# Patient Record
Sex: Male | Born: 1990 | Race: White | Hispanic: No | Smoking: Never smoker
Health system: Southern US, Community
[De-identification: ages and names within clinical notes are randomized; demographics above are authoritative.]

## PROBLEM LIST (undated history)

## (undated) DIAGNOSIS — T8859XA Other complications of anesthesia, initial encounter: Secondary | ICD-10-CM

## (undated) DIAGNOSIS — T4145XA Adverse effect of unspecified anesthetic, initial encounter: Secondary | ICD-10-CM

## (undated) HISTORY — PX: SHOULDER SURGERY: SHX246

---

## 2016-07-12 ENCOUNTER — Emergency Department
Admission: EM | Admit: 2016-07-12 | Discharge: 2016-07-12 | Disposition: A | Discharge status: Home / Self Care | Payer: Worker's Compensation | Attending: Emergency Medicine | Admitting: Emergency Medicine

## 2016-07-12 ENCOUNTER — Encounter: Payer: Self-pay | Admitting: Emergency Medicine

## 2016-07-12 DIAGNOSIS — S60511A Abrasion of right hand, initial encounter: Secondary | ICD-10-CM

## 2016-07-12 DIAGNOSIS — Y990 Civilian activity done for income or pay: Secondary | ICD-10-CM | POA: Insufficient documentation

## 2016-07-12 DIAGNOSIS — Y9389 Activity, other specified: Secondary | ICD-10-CM | POA: Diagnosis not present

## 2016-07-12 DIAGNOSIS — Z7721 Contact with and (suspected) exposure to potentially hazardous body fluids: Secondary | ICD-10-CM

## 2016-07-12 DIAGNOSIS — Y929 Unspecified place or not applicable: Secondary | ICD-10-CM | POA: Diagnosis not present

## 2016-07-12 DIAGNOSIS — S61411A Laceration without foreign body of right hand, initial encounter: Secondary | ICD-10-CM | POA: Diagnosis present

## 2016-07-12 DIAGNOSIS — R6884 Jaw pain: Secondary | ICD-10-CM | POA: Diagnosis not present

## 2016-07-12 MED ORDER — AMOXICILLIN-POT CLAVULANATE 875-125 MG PO TABS: 1.0000 | ORAL_TABLET | Freq: Two times a day (BID) | ORAL | 0 refills | Status: DC
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
## 2016-07-12 NOTE — ED Notes (Signed)
Blood work walked to lab by this RN

## 2016-09-26 ENCOUNTER — Ambulatory Visit: Payer: Self-pay | Admitting: Physician Assistant

## 2016-10-28 ENCOUNTER — Encounter (INDEPENDENT_AMBULATORY_CARE_PROVIDER_SITE_OTHER): Payer: Self-pay

## 2016-10-28 ENCOUNTER — Ambulatory Visit: Payer: Self-pay | Admitting: Physician Assistant

## 2016-10-28 ENCOUNTER — Encounter: Payer: Self-pay | Admitting: Physician Assistant

## 2016-10-28 VITALS — BP 136/79 | HR 69 | Temp 98.5°F | Resp 16 | Ht 68.0 in | Wt 214.0 lb

## 2016-10-28 DIAGNOSIS — Z0000 Encounter for general adult medical examination without abnormal findings: Secondary | ICD-10-CM

## 2016-10-28 NOTE — Addendum Note (Signed)
Addended by: Catha BrowEACON, Siona Coulston T on: 10/28/2016 08:53 AM   Modules accepted: Orders

## 2016-10-28 NOTE — Progress Notes (Signed)
   Subjective: Physical exam    Patient ID: Cody Dudley, male    DOB: 12/06/1990, 26 y.o.   MRN: [id number]  HPI Patient present for annual physical exam. Voices no concerns.   Review of Systems Negative    Objective:   Physical Exam HEENT unremarkable. Neck supple w/o adenopathy. Lungs CTA and Heart RRR. Negative HSM, normoactive BS, SNTTP.  No upper/lower extremity deformities. F/E ROM. Strength 5/5. No cervical of lumbar spine deformity. F/E ROM. CNII-XII grossly intact.       Assessment & Plan:Well Exam.  Follow up for lab results.

## 2016-10-29 LAB — VITAMIN D 25 HYDROXY (VIT D DEFICIENCY, FRACTURES): Vit D, 25-Hydroxy: 32.6 ng/mL (ref 30.0–100.0)

## 2016-10-29 LAB — CMP12+LP+TP+TSH+6AC+CBC/D/PLT
A/G RATIO: 1.7 (ref 1.2–2.2)
ALBUMIN: 4.3 g/dL (ref 3.5–5.5)
ALT: 21 IU/L (ref 0–44)
AST: 15 IU/L (ref 0–40)
Alkaline Phosphatase: 92 IU/L (ref 39–117)
BILIRUBIN TOTAL: 0.6 mg/dL (ref 0.0–1.2)
BUN/Creatinine Ratio: 11 (ref 9–20)
BUN: 11 mg/dL (ref 6–20)
Basophils Absolute: 0 10*3/uL (ref 0.0–0.2)
Basos: 0 %
CHOLESTEROL TOTAL: 134 mg/dL (ref 100–199)
Calcium: 9 mg/dL (ref 8.7–10.2)
Chloride: 104 mmol/L (ref 96–106)
Chol/HDL Ratio: 4.1 ratio (ref 0.0–5.0)
Creatinine, Ser: 0.96 mg/dL (ref 0.76–1.27)
EOS (ABSOLUTE): 0.1 10*3/uL (ref 0.0–0.4)
EOS: 1 %
ESTIMATED CHD RISK: 0.8 times avg. (ref 0.0–1.0)
FREE THYROXINE INDEX: 2 (ref 1.2–4.9)
GFR calc non Af Amer: 108 mL/min/{1.73_m2} (ref >59)
GFR, EST AFRICAN AMERICAN: 125 mL/min/{1.73_m2} (ref >59)
GGT: 16 IU/L (ref 0–65)
Globulin, Total: 2.5 g/dL (ref 1.5–4.5)
Glucose: 90 mg/dL (ref 65–99)
HDL: 33 mg/dL — AB (ref >39)
HEMOGLOBIN: 16.6 g/dL (ref 13.0–17.7)
Hematocrit: 49.1 % (ref 37.5–51.0)
IMMATURE GRANS (ABS): 0 10*3/uL (ref 0.0–0.1)
IRON: 90 ug/dL (ref 38–169)
Immature Granulocytes: 0 %
LDH: 185 IU/L (ref 121–224)
LDL Calculated: 88 mg/dL (ref 0–99)
Lymphocytes Absolute: 1.5 10*3/uL (ref 0.7–3.1)
Lymphs: 23 %
MCH: 30.6 pg (ref 26.6–33.0)
MCHC: 33.8 g/dL (ref 31.5–35.7)
MCV: 91 fL (ref 79–97)
Monocytes Absolute: 0.4 10*3/uL (ref 0.1–0.9)
Monocytes: 7 %
NEUTROS ABS: 4.6 10*3/uL (ref 1.4–7.0)
NEUTROS PCT: 69 %
PHOSPHORUS: 3.7 mg/dL (ref 2.5–4.5)
PLATELETS: 236 10*3/uL (ref 150–379)
Potassium: 4.2 mmol/L (ref 3.5–5.2)
RBC: 5.42 x10E6/uL (ref 4.14–5.80)
RDW: 13.6 % (ref 12.3–15.4)
SODIUM: 142 mmol/L (ref 134–144)
T3 UPTAKE RATIO: 31 % (ref 24–39)
T4, Total: 6.3 ug/dL (ref 4.5–12.0)
TOTAL PROTEIN: 6.8 g/dL (ref 6.0–8.5)
TSH: 1.06 u[IU]/mL (ref 0.450–4.500)
Triglycerides: 63 mg/dL (ref 0–149)
Uric Acid: 6.4 mg/dL (ref 3.7–8.6)
VLDL CHOLESTEROL CAL: 13 mg/dL (ref 5–40)
WBC: 6.6 10*3/uL (ref 3.4–10.8)

## 2016-11-10 ENCOUNTER — Encounter: Payer: Self-pay | Admitting: Emergency Medicine

## 2016-11-10 ENCOUNTER — Emergency Department: Payer: Worker's Compensation

## 2016-11-10 ENCOUNTER — Emergency Department
Admission: EM | Admit: 2016-11-10 | Discharge: 2016-11-10 | Disposition: A | Discharge status: Home / Self Care | Payer: Worker's Compensation | Attending: Emergency Medicine | Admitting: Emergency Medicine

## 2016-11-10 DIAGNOSIS — Y990 Civilian activity done for income or pay: Secondary | ICD-10-CM | POA: Insufficient documentation

## 2016-11-10 DIAGNOSIS — Y939 Activity, unspecified: Secondary | ICD-10-CM | POA: Insufficient documentation

## 2016-11-10 DIAGNOSIS — W03XXXA Other fall on same level due to collision with another person, initial encounter: Secondary | ICD-10-CM | POA: Diagnosis not present

## 2016-11-10 DIAGNOSIS — Z23 Encounter for immunization: Secondary | ICD-10-CM | POA: Diagnosis not present

## 2016-11-10 DIAGNOSIS — Y929 Unspecified place or not applicable: Secondary | ICD-10-CM | POA: Insufficient documentation

## 2016-11-10 DIAGNOSIS — S6991XA Unspecified injury of right wrist, hand and finger(s), initial encounter: Secondary | ICD-10-CM | POA: Diagnosis present

## 2016-11-10 DIAGNOSIS — M25512 Pain in left shoulder: Secondary | ICD-10-CM | POA: Diagnosis not present

## 2016-11-10 DIAGNOSIS — S60511A Abrasion of right hand, initial encounter: Secondary | ICD-10-CM | POA: Diagnosis not present

## 2016-11-10 MED ORDER — TETANUS-DIPHTH-ACELL PERTUSSIS 5-2.5-18.5 LF-MCG/0.5 IM SUSP
INTRAMUSCULAR | Status: AC
  Administered 2016-11-10: 0.5 mL via INTRAMUSCULAR
  Filled 2016-11-10: qty 0.5

## 2016-11-10 MED ORDER — TETANUS-DIPHTH-ACELL PERTUSSIS 5-2.5-18.5 LF-MCG/0.5 IM SUSP
0.5000 mL | Freq: Once | INTRAMUSCULAR | Status: AC
  Administered 2016-11-10: 0.5 mL via INTRAMUSCULAR
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
## 2016-11-10 NOTE — ED Notes (Signed)
Patient transported to X-ray 

## 2016-11-10 NOTE — Discharge Instructions (Signed)
We gave you a tetanus vaccine today.

## 2016-11-10 NOTE — ED Notes (Signed)
Pt declined imaging.
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
## 2017-05-23 ENCOUNTER — Ambulatory Visit
Admission: EM | Admit: 2017-05-23 | Discharge: 2017-05-23 | Disposition: A | Discharge status: Home / Self Care | Payer: Managed Care, Other (non HMO) | Attending: Family Medicine | Admitting: Family Medicine

## 2017-05-23 ENCOUNTER — Other Ambulatory Visit: Payer: Self-pay

## 2017-05-23 DIAGNOSIS — J039 Acute tonsillitis, unspecified: Secondary | ICD-10-CM

## 2017-05-23 DIAGNOSIS — J029 Acute pharyngitis, unspecified: Secondary | ICD-10-CM | POA: Diagnosis not present

## 2017-05-23 LAB — RAPID STREP SCREEN (MED CTR MEBANE ONLY): Streptococcus, Group A Screen (Direct): NEGATIVE

## 2017-05-23 MED ORDER — DEXAMETHASONE SODIUM PHOSPHATE 10 MG/ML IJ SOLN
10.0000 mg | Freq: Once | INTRAMUSCULAR | Status: AC
  Administered 2017-05-23: 10 mg via INTRAMUSCULAR

## 2017-05-23 MED ORDER — AMOXICILLIN-POT CLAVULANATE 875-125 MG PO TABS: 1.0000 | ORAL_TABLET | Freq: Two times a day (BID) | ORAL | 0 refills | Status: DC

## 2017-05-23 NOTE — ED Triage Notes (Signed)
Patient complains of sore throat that started on Sunday. Patient states that he has painful swallowing and fever. Patient states that he believes he has strep, previous history.

## 2017-05-23 NOTE — Discharge Instructions (Signed)
Tylenol/Motrin as needed for pain/fever °

## 2017-05-23 NOTE — ED Provider Notes (Signed)
[facility]    CSN: [id number] Arrival date & time: 05/23/17  1434     History   Chief Complaint Chief Complaint  Patient presents with  . Sore Throat    HPI Cody Dudley is a 27 y.o. male presented to clinic with CC of sore throat , fever x 2 days. Painful swallowing. Taking Naprosyn for pain and fever.  The history is provided by the patient.  Sore Throat  This is a new problem. The current episode started 2 days ago. The problem occurs constantly. The problem has been gradually worsening. The symptoms are aggravated by eating and swallowing. The symptoms are relieved by NSAIDs.    History reviewed. No pertinent past medical history.  There are no active problems to display for this patient.   Past Surgical History:  Procedure Laterality Date  . SHOULDER SURGERY         Home Medications    Prior to Admission medications   Medication Sig Start Date End Date Taking? Authorizing Provider  naproxen (NAPROSYN) 500 MG tablet Take 500 mg by mouth 2 (two) times daily with a meal.   Yes [provider]  amoxicillin-clavulanate (AUGMENTIN) 875-125 MG tablet Take 1 tablet by mouth 2 (two) times daily. Patient not taking: Reported on 10/28/2016 07/12/16   Chinita Pester, FNP    Family History Family History  Problem Relation Age of Onset  . Kidney failure Mother     Social History Social History   Tobacco Use  . Smoking status: Never Smoker  . Smokeless tobacco: Never Used  Substance Use Topics  . Alcohol use: No  . Drug use: No     Allergies   Patient has no known allergies.   Review of Systems Review of Systems  Constitutional: Positive for fever.  HENT: Positive for sore throat.   Respiratory: Negative.   Cardiovascular: Negative.   Neurological: Negative.      Physical Exam Triage Vital Signs ED Triage Vitals [05/23/17 1505]  Enc Vitals Group     BP 121/84     Pulse Rate 86     Resp 18     Temp 98.3 F (36.8 C)    Temp Source Oral     SpO2 99 %     Weight 215 lb (97.5 kg)     Height 5\' 8"  (1.727 m)     Head Circumference      Peak Flow      Pain Score 4     Pain Loc      Pain Edu?      Excl. in GC?    No data found.  Updated Vital Signs BP 121/84 (BP Location: Left Arm)   Pulse 86   Temp 98.3 F (36.8 C) (Oral)   Resp 18   Ht 5\' 8"  (1.727 m)   Wt 215 lb (97.5 kg)   SpO2 99%   BMI 32.69 kg/m   Visual Acuity Right Eye Distance:   Left Eye Distance:   Bilateral Distance:    Right Eye Near:   Left Eye Near:    Bilateral Near:     Physical Exam  Constitutional: He appears well-developed and well-nourished.  HENT:  Head: Normocephalic.  Mouth/Throat: Oropharyngeal exudate (B/L grossly inflammed tonsillar glands with exudate. ), posterior oropharyngeal edema and posterior oropharyngeal erythema present. No tonsillar abscesses.     UC Treatments / Results  Labs (all labs ordered are listed, but only abnormal results are displayed) Labs Reviewed  RAPID  STREP SCREEN (NOT AT [facility])  CULTURE, GROUP A STREP [facility])    EKG  EKG Interpretation None       Radiology No results found.  Procedures Procedures (including critical care time)  Medications Ordered in UC Medications  dexamethasone (DECADRON) injection 10 mg (not administered)     Initial Impression / Assessment and Plan / UC Course  I have reviewed the triage vital signs and the nursing notes.  Pertinent labs & imaging results that were available during my care of the patient were reviewed by me and considered in my medical decision making (see chart for details).    Rapid Strep. Clinical presentation and physical exam highly likely for Bacterial tonsillitis. Findings dicussed with patient. ABX Tx initiated based on Exam. Pt agrees with plan of care. Will send throat culture.  Final Clinical Impressions(s) / UC Diagnoses   Final diagnoses:  Acute tonsillitis, unspecified etiology    ED Discharge Orders     None       Controlled Substance Prescriptions [place] Controlled Substance Registry consulted? Not Applicable   Reinaldo RaddleMultani, Francine Hannan, NP 05/23/17 1607

## 2017-05-26 ENCOUNTER — Ambulatory Visit: Payer: Self-pay | Admitting: Emergency Medicine

## 2017-05-26 ENCOUNTER — Encounter: Payer: Self-pay | Admitting: Emergency Medicine

## 2017-05-26 VITALS — BP 120/80 | HR 78 | Temp 98.3°F | Resp 16

## 2017-05-26 DIAGNOSIS — J038 Acute tonsillitis due to other specified organisms: Secondary | ICD-10-CM

## 2017-05-26 LAB — CULTURE, GROUP A STREP (THRC)

## 2017-05-26 MED ORDER — FIRST-DUKES MOUTHWASH MT SUSP: OROMUCOSAL | 1 refills | Status: DC

## 2017-05-26 NOTE — Progress Notes (Signed)
Subjective. Patient enters with a severe sore throat. Symptoms began 1 week ago. On New Year's Day he was seen at the [facility]. At that time strep screen was negative and he was started on Augmentin 875 twice a day along with being given a shot of steroids. He has continued on naproxen twice a day but feels that his symptoms are unchanged. Review of systems  noncontributory except as relates to the present illness. Objective. Patient is alert and cooperative and in no distress. Pupils equal and reactive without conjunctivitis. TMs are clear. Nose is not congested. Neck exam reveals shotty posterior cervical adenopathy with minimal anterior cervical adenopathy. Tonsils are cryptic in appearance with exudate filling the crypts. Chest clear to auscultation and percussion. Abdomen is flat and soft with mild left upper abdominal discomfort without mass. Assessment. Patient has a severe cryptic tonsillitis with exudate. He did receive a shot of steroids so white count will be difficult to interpret. Plan.  Will check EBV titers and CMV titers. And CBC. Continue Augmentin and naproxen with food. [facility] . Dukes mouthwash.

## 2017-05-27 LAB — CMV ABS, IGG+IGM (CYTOMEGALOVIRUS)
CMV Ab - IgG: 5.8 U/mL — ABNORMAL HIGH (ref 0.00–0.59)
CMV IgM Ser EIA-aCnc: <30 AU/mL (ref 0.0–29.9)

## 2017-05-27 LAB — CBC WITH DIFFERENTIAL/PLATELET
BASOS: 0 %
Basophils Absolute: 0 10*3/uL (ref 0.0–0.2)
EOS (ABSOLUTE): 0.1 10*3/uL (ref 0.0–0.4)
EOS: 1 %
HEMATOCRIT: 44.1 % (ref 37.5–51.0)
HEMOGLOBIN: 15.4 g/dL (ref 13.0–17.7)
IMMATURE GRANULOCYTES: 1 %
Immature Grans (Abs): 0.1 10*3/uL (ref 0.0–0.1)
LYMPHS ABS: 2.3 10*3/uL (ref 0.7–3.1)
Lymphs: 21 %
MCH: 30.5 pg (ref 26.6–33.0)
MCHC: 34.9 g/dL (ref 31.5–35.7)
MCV: 87 fL (ref 79–97)
MONOCYTES: 7 %
MONOS ABS: 0.8 10*3/uL (ref 0.1–0.9)
NEUTROS PCT: 70 %
Neutrophils Absolute: 7.9 10*3/uL — ABNORMAL HIGH (ref 1.4–7.0)
Platelets: 269 10*3/uL (ref 150–379)
RBC: 5.05 x10E6/uL (ref 4.14–5.80)
RDW: 13.4 % (ref 12.3–15.4)
WBC: 11.2 10*3/uL — ABNORMAL HIGH (ref 3.4–10.8)

## 2017-05-27 LAB — EPSTEIN-BARR VIRUS VCA ANTIBODY PANEL
EBV NA IgG: 113 U/mL — ABNORMAL HIGH (ref 0.0–17.9)
EBV VCA IgG: 387 U/mL — ABNORMAL HIGH (ref 0.0–17.9)

## 2017-06-19 ENCOUNTER — Ambulatory Visit: Payer: Self-pay | Admitting: Emergency Medicine

## 2017-06-19 ENCOUNTER — Encounter: Payer: Self-pay | Admitting: Emergency Medicine

## 2017-06-19 VITALS — BP 130/90 | HR 80 | Temp 98.3°F

## 2017-06-19 DIAGNOSIS — K6289 Other specified diseases of anus and rectum: Secondary | ICD-10-CM

## 2017-06-19 MED ORDER — AMOXICILLIN-POT CLAVULANATE 875-125 MG PO TABS: 1.0000 | ORAL_TABLET | Freq: Two times a day (BID) | ORAL | 0 refills | Status: DC

## 2017-06-19 MED ORDER — HYDROCORTISONE ACETATE 25 MG RE SUPP: 25.0000 mg | Freq: Two times a day (BID) | RECTAL | 0 refills | Status: DC

## 2017-06-19 NOTE — Progress Notes (Signed)
Subjective. Patient presents with severe pain in the right perianal area. History states reveals that 3 months ago he had some bright red rectal bleeding with bowel movements. This subsequently subsided and he did well until about 3 weeks ago he started having a poking sensation in the right anal area. He denies, to that area. He has not had any bleeding. He has not had fever or chills. The pain has become constant and significant. He is not able to sit without significant discomfort. Patient recently took Augmentin for strep throat. Review of systems. No fever or chills. No nausea or vomiting. He continues to have bowel movements. He has been trying naproxen for pain. Family history. Negative for colon cancer rectal cancer or any inflammatory bowel disease. Objective. Abdomen soft without tenderness normal bowel sounds. Rectal exam reveals tenderness on the right perianal area. External on the anus is also tenderness between 2:00 and 5:00 on the right. The area on the right sidewall of the anus is indurated and tender to touch. Assessment. I suspect he has an early right perirectal abscess. This could be a thrombosed internal hemorrhoid. I think probably early perirectal is more likely. Plan.  Continue naproxen twice a day with the period Augmentin 875 twice a day with food. Sitz baths twice a day. We'll try and get an appointment with a general surgeon as soon as possible Probiotics twice a day. Anusol HC suppositories twice a day. Appointment made for Thursday to be seen by a general surgeon.

## 2017-06-21 ENCOUNTER — Encounter: Payer: Self-pay | Admitting: *Deleted

## 2017-06-22 ENCOUNTER — Ambulatory Visit: Payer: Self-pay | Admitting: General Surgery

## 2017-07-12 ENCOUNTER — Encounter: Payer: Self-pay | Admitting: *Deleted

## 2017-07-24 ENCOUNTER — Encounter: Payer: Self-pay | Admitting: General Surgery

## 2017-08-15 ENCOUNTER — Encounter: Payer: Self-pay | Admitting: General Surgery

## 2017-08-15 ENCOUNTER — Ambulatory Visit (INDEPENDENT_AMBULATORY_CARE_PROVIDER_SITE_OTHER): Payer: Managed Care, Other (non HMO) | Admitting: General Surgery

## 2017-08-15 VITALS — BP 120/90 | HR 75 | Ht 68.0 in | Wt 211.0 lb

## 2017-08-15 DIAGNOSIS — K603 Anal fistula: Secondary | ICD-10-CM | POA: Diagnosis not present

## 2017-08-15 NOTE — Progress Notes (Signed)
Patient ID: Cody Dudley, male   DOB: 08/09/1990, 27 y.o.   MRN: [id number]  Chief Complaint  Patient presents with  . other    abscess    HPI Cody Levanslexander L Kienle is a 27 y.o. male here today for a evaluation of a perirectal abscess. Patient noticed this area about four months ago. He states he noticed some bright red blood on the paper and in the bowel.  In January he states the area was very painful to sit. The area drainage off and on.  No improvement with a recent course of oral antibiotics. No history of change in bowel habits prior to onset of present symptoms.  Moves his bowel daily.     History reviewed. No pertinent past medical history.  Past Surgical History:  Procedure Laterality Date  . SHOULDER SURGERY Left    rotator cuff    Family History  Problem Relation Age of Onset  . Kidney failure Mother     Social History Social History   Tobacco Use  . Smoking status: Never Smoker  . Smokeless tobacco: Never Used  Substance Use Topics  . Alcohol use: Yes  . Drug use: No    No Known Allergies  Current Outpatient Medications  Medication Sig Dispense Refill  . ibuprofen (ADVIL,MOTRIN) 200 MG tablet Take 800 mg by mouth every 6 (six) hours as needed for headache or moderate pain.     No current facility-administered medications for this visit.     Review of Systems Review of Systems  Constitutional: Negative.   Respiratory: Negative.   Cardiovascular: Negative.     Blood pressure 120/90, pulse 75, height 5\' 8"  (1.727 m), weight 211 lb (95.7 kg).  Physical Exam Physical Exam  Constitutional: He is oriented to person, place, and time. He appears well-developed and well-nourished.  Cardiovascular: Normal rate, regular rhythm and normal heart sounds.  Pulmonary/Chest: Effort normal.  Genitourinary:     Genitourinary Comments: Anal fistula  Lymphadenopathy:    He has no cervical adenopathy.  Neurological: He is alert and oriented to person, place,  and time.  Skin: Skin is warm and dry.    Data Reviewed MD notes of January 2019.  Of May 26, 2017 showed a hemoglobin of 15.4 with an MCV of 87, white blood cell count of 11,200 with normal differential, platelet count of 269,000.  CBC  Assessment    Anal fistula    Plan     Recommend surgery for anal fistula.  Unroofing and healing by secondary intent and reviewed.  Potential for continence issues discussed, unlikely.  HPI, Physical Exam, Assessment and Plan have been scribed under the direction and in the presence of Donnalee CurryJeffrey Bernetha Anschutz, MD.  Ples SpecterJessica Qualls, CMA  I have completed the exam and reviewed the above documentation for accuracy and completeness.  I agree with the above.  [employer] has been used and any errors in dictation or transcription are unintentional.  Donnalee CurryJeffrey Zakya Halabi, M.D., F.A.C.S.  [facility], he will pre admit by phone. The patient is aware of date and instructions.  Documented by Caryl-Lyn Louanna RawM Kennedy LPN  Merrily PewJeffrey W Mallika Sanmiguel 08/15/2017, 8:55 PM

## 2017-08-15 NOTE — Patient Instructions (Addendum)
The patient is aware to call back for any questions or new concerns.  Schedule Surgery  Anal Fistula An anal fistula is an abnormal tunnel that develops between the bowel and the skin near the outside of the anus, where stool (feces) comes out. The anus has many tiny glands that make lubricating fluid. Sometimes, these glands become plugged and infected, and that can cause a fluid-filled pocket (abscess) to form. An anal fistula often develops after this infection or abscess. What are the causes? In most cases, an anal fistula is caused by a past or current anal abscess. Other causes include:  A complication of surgery.  Trauma to the rectal area.  Radiation to the area.  Medical conditions or diseases, such as: ? Chronic inflammatory bowel disease, such as Crohn disease or ulcerative colitis. ? Colon cancer or rectal cancer. ? Diverticular disease, such as diverticulitis. ? An STD (sexually transmitted disease), such as gonorrhea, chlamydia, or syphilis. ? An infection that is caused by HIV (human immunodeficiency virus). ? Foreign body in the rectum.  What are the signs or symptoms? Symptoms of this condition include:  Throbbing or constant pain that may be worse while you are sitting.  Swelling or irritation around the anus.  Drainage of pus or blood from an opening near the anus.  Pain with bowel movements.  Fever or chills.  How is this diagnosed? Your health care provider will examine the area to find the openings of the anal fistula and the fistula tract. The external opening of the anal fistula may be seen during a physical exam. You may also have tests, including:  An exam of the rectal area with a gloved hand (digital rectal exam).  An exam with a probe or scope to help locate the internal opening of the fistula.  Imaging tests to find the exact location and path of the fistula. These tests may include X-rays, an ultrasound, a CT scan, or MRI. The path is made  visible by a dye that is injected into the fistula opening.  You may have other tests to find the cause of the anal fistula. How is this treated? The most common treatment for an anal fistula is surgery. The type of surgery that is used will depend on where the fistula is located and how complex the fistula is. Surgical options include:  A fistulotomy. The whole fistula is opened up, and the contents are drained to promote healing.  Seton placement. A silk string (seton) is placed into the fistula during a fistulotomy. This helps to drain any infection to promote healing.  Advancement flap procedure. Tissue is removed from your rectum or the skin around the anus and is attached to the opening of the fistula.  Bioprosthetic plug. A cone-shaped plug is made from your tissue and is used to block the opening of the fistula.  Some anal fistulas do not require surgery. A nonsurgical treatment option involves injecting a fibrin glue to seal the fistula. You also may be prescribed an antibiotic medicine to treat an infection. Follow these instructions at home: Medicines  Take over-the-counter and prescription medicines only as told by your health care provider.  If you were prescribed an antibiotic medicine, take it as told by your health care provider. Do not stop taking the antibiotic even if you start to feel better.  Use a stool softener or a laxative if told to do so by your health care provider. General instructions  Eat a high-fiber diet as told by  your health care provider. This can help to prevent constipation.  Drink enough fluid to keep your urine clear or pale yellow.  Take a warm sitz bath for 15-20 minutes, 3-4 times per day, or as told by your health care provider. Sitz baths can ease your pain and discomfort and help with healing.  Follow good hygiene to keep the anal area as clean and dry as possible. Use wet toilet paper or a moist towelette after each bowel movement.  Keep  all follow-up visits as told by your health care provider. This is important. Contact a health care provider if:  You have increased pain that is not controlled with medicines.  You have new redness or swelling around the anal area.  You have new fluid, blood, or pus coming from the anal area.  You have tenderness or warmth around the anal area. Get help right away if:  You have a fever.  You have severe pain.  You have chills or diarrhea.  You have severe problems urinating or having a bowel movement. This information is not intended to replace advice given to you by your health care provider. Make sure you discuss any questions you have with your health care provider. Document Released: 04/21/2008 Document Revised: 10/15/2015 Document Reviewed: 08/04/2014 Elsevier Interactive Patient Education  [employer].   The patient is scheduled for surgery at [facility] on 08/18/17, he will pre admit by phone. The patient is aware of date and instructions.

## 2017-08-16 ENCOUNTER — Inpatient Hospital Stay: Admission: RE | Admit: 2017-08-16 | Payer: Self-pay | Source: Ambulatory Visit

## 2017-08-17 ENCOUNTER — Encounter
Admission: RE | Admit: 2017-08-17 | Discharge: 2017-08-17 | Disposition: A | Discharge status: Home / Self Care | Payer: Managed Care, Other (non HMO) | Source: Ambulatory Visit | Attending: General Surgery | Admitting: General Surgery

## 2017-08-17 ENCOUNTER — Other Ambulatory Visit: Payer: Self-pay

## 2017-08-17 HISTORY — DX: Other complications of anesthesia, initial encounter: T88.59XA

## 2017-08-17 HISTORY — DX: Adverse effect of unspecified anesthetic, initial encounter: T41.45XA

## 2017-08-17 MED ORDER — SODIUM CHLORIDE 0.9 % IV SOLN
2.0000 g | INTRAVENOUS | Status: AC
  Administered 2017-08-18: 2 g via INTRAVENOUS
  Filled 2017-08-17: qty 2

## 2017-08-17 NOTE — Patient Instructions (Signed)
Your procedure is scheduled on: 08-18-17  Report to Same Day Surgery 2nd floor medical mall [facility](Medical Mall Entrance-take elevator on left to 2nd floor.  Check in with surgery information desk.) To find out your arrival time please call [phone] between 1PM - 3PM on 08-17-17  Remember: Instructions that are not followed completely may result in serious medical risk, up to and including death, or upon the discretion of your surgeon and anesthesiologist your surgery may need to be rescheduled.    _x___ 1. Do not eat food after midnight the night before your procedure. NO GUM OR CANDY AFTER MIDNIGHT.  You may drink clear liquids up to 2 hours before you are scheduled to arrive at the hospital for your procedure.  Do not drink clear liquids within 2 hours of your scheduled arrival to the hospital.  Clear liquids include  --Water or Apple juice without pulp  --Clear carbohydrate beverage such as ClearFast or Gatorade  --Black Coffee or Clear Tea (No milk, no creamers, do not add anything to the coffee or Tea     __x__ 2. No Alcohol for 24 hours before or after surgery.   __x__3. No Smoking or e-cigarettes for 24 prior to surgery.  Do not use any chewable tobacco products for at least 6 hour prior to surgery   ____  4. Bring all medications with you on the day of surgery if instructed.    __x__ 5. Notify your doctor if there is any change in your medical condition     (cold, fever, infections).    x___6. On the morning of surgery brush your teeth with toothpaste and water.  You may rinse your mouth with mouth wash if you wish.  Do not swallow any toothpaste or mouthwash.   Do not wear jewelry, make-up, hairpins, clips or nail polish.  Do not wear lotions, powders, or perfumes. You may wear deodorant.  Do not shave 48 hours prior to surgery. Men may shave face and neck.  Do not bring valuables to the hospital.    [facility] any belongings or valuables.     Contacts, dentures or bridgework may not be worn into surgery.  Leave your suitcase in the car. After surgery it may be brought to your room.  For patients admitted to the hospital, discharge time is determined by your  treatment team.  _  Patients discharged the day of surgery will not be allowed to drive home.  You will need someone to drive you home and stay with you the night of your procedure.     ____ Take anti-hypertensive listed below, cardiac, seizure, asthma, anti-reflux and psychiatric medicines. These include:  1. NONE  2.  3.  4.  5.  6.  ____Fleets enema or Magnesium Citrate as directed.   ____ Use CHG Soap or sage wipes as directed on instruction sheet   ____ Use inhalers on the day of surgery and bring to hospital day of surgery  ____ Stop Metformin and Janumet 2 days prior to surgery.    ____ Take 1/2 of usual insulin dose the night before surgery and none on the morning surgery.   ____ Follow recommendations from Cardiologist, Pulmonologist or PCP regarding stopping Aspirin, Coumadin, Plavix ,Eliquis, Effient, or Pradaxa, and Pletal.  ____Stop Anti-inflammatories such as Advil, Aleve, Ibuprofen, Motrin, Naproxen, Naprosyn, Goodies powders or aspirin products. OK to take Tylenol    ____ Stop supplements until after surgery.    ____ Bring C-Pap to  the hospital.

## 2017-08-18 ENCOUNTER — Ambulatory Visit: Payer: Managed Care, Other (non HMO) | Admitting: Anesthesiology

## 2017-08-18 ENCOUNTER — Ambulatory Visit
Admission: RE | Admit: 2017-08-18 | Discharge: 2017-08-18 | Disposition: A | Discharge status: Home / Self Care | Payer: Managed Care, Other (non HMO) | Source: Ambulatory Visit | Attending: General Surgery | Admitting: General Surgery

## 2017-08-18 ENCOUNTER — Encounter
Admission: RE | Disposition: A | Discharge status: Home / Self Care | Payer: Self-pay | Source: Ambulatory Visit | Attending: General Surgery

## 2017-08-18 ENCOUNTER — Encounter: Payer: Self-pay | Admitting: *Deleted

## 2017-08-18 DIAGNOSIS — K603 Anal fistula: Secondary | ICD-10-CM | POA: Diagnosis not present

## 2017-08-18 DIAGNOSIS — K605 Anorectal fistula: Secondary | ICD-10-CM | POA: Diagnosis not present

## 2017-08-18 DIAGNOSIS — K611 Rectal abscess: Secondary | ICD-10-CM | POA: Diagnosis present

## 2017-08-18 HISTORY — PX: ANAL FISTULOTOMY: SHX6423

## 2017-08-18 SURGERY — ANAL FISTULOTOMY
Anesthesia: General | Wound class: Clean Contaminated

## 2017-08-18 MED ORDER — FENTANYL CITRATE (PF) 100 MCG/2ML IJ SOLN: 25.0000 ug | INTRAMUSCULAR | Status: DC | PRN

## 2017-08-18 MED ORDER — FAMOTIDINE 20 MG PO TABS
ORAL_TABLET | ORAL | Status: AC
  Administered 2017-08-18: 20 mg via ORAL
  Filled 2017-08-18: qty 1

## 2017-08-18 MED ORDER — GABAPENTIN 300 MG PO CAPS
300.0000 mg | ORAL_CAPSULE | ORAL | Status: AC
  Administered 2017-08-18: 300 mg via ORAL

## 2017-08-18 MED ORDER — FENTANYL CITRATE (PF) 100 MCG/2ML IJ SOLN
INTRAMUSCULAR | Status: AC
  Filled 2017-08-18: qty 2

## 2017-08-18 MED ORDER — PROPOFOL 10 MG/ML IV BOLUS
INTRAVENOUS | Status: DC | PRN
  Administered 2017-08-18: 150 mg via INTRAVENOUS

## 2017-08-18 MED ORDER — DEXAMETHASONE SODIUM PHOSPHATE 10 MG/ML IJ SOLN
INTRAMUSCULAR | Status: DC | PRN
  Administered 2017-08-18: 10 mg via INTRAVENOUS

## 2017-08-18 MED ORDER — OXYCODONE-ACETAMINOPHEN 5-325 MG PO TABS: 1.0000 | ORAL_TABLET | ORAL | 0 refills | Status: DC | PRN

## 2017-08-18 MED ORDER — KETOROLAC TROMETHAMINE 30 MG/ML IJ SOLN
INTRAMUSCULAR | Status: AC
  Filled 2017-08-18: qty 1

## 2017-08-18 MED ORDER — MIDAZOLAM HCL 2 MG/2ML IJ SOLN
INTRAMUSCULAR | Status: AC
  Filled 2017-08-18: qty 2

## 2017-08-18 MED ORDER — CELECOXIB 200 MG PO CAPS
ORAL_CAPSULE | ORAL | Status: AC
  Administered 2017-08-18: 200 mg via ORAL
  Filled 2017-08-18: qty 1

## 2017-08-18 MED ORDER — ONDANSETRON HCL 4 MG/2ML IJ SOLN
INTRAMUSCULAR | Status: AC
  Filled 2017-08-18: qty 2

## 2017-08-18 MED ORDER — BUPIVACAINE LIPOSOME 1.3 % IJ SUSP
INTRAMUSCULAR | Status: AC
  Filled 2017-08-18: qty 20

## 2017-08-18 MED ORDER — ONDANSETRON HCL 4 MG/2ML IJ SOLN
INTRAMUSCULAR | Status: DC | PRN
  Administered 2017-08-18: 4 mg via INTRAVENOUS

## 2017-08-18 MED ORDER — FAMOTIDINE 20 MG PO TABS
20.0000 mg | ORAL_TABLET | Freq: Once | ORAL | Status: AC
  Administered 2017-08-18: 20 mg via ORAL

## 2017-08-18 MED ORDER — PROMETHAZINE HCL 25 MG/ML IJ SOLN: 6.2500 mg | INTRAMUSCULAR | Status: DC | PRN

## 2017-08-18 MED ORDER — MIDAZOLAM HCL 2 MG/2ML IJ SOLN
INTRAMUSCULAR | Status: DC | PRN
  Administered 2017-08-18: 2 mg via INTRAVENOUS

## 2017-08-18 MED ORDER — KETOROLAC TROMETHAMINE 30 MG/ML IJ SOLN
INTRAMUSCULAR | Status: DC | PRN
  Administered 2017-08-18: 30 mg via INTRAVENOUS

## 2017-08-18 MED ORDER — LACTATED RINGERS IV SOLN
INTRAVENOUS | Status: DC
  Administered 2017-08-18: 09:00:00 via INTRAVENOUS

## 2017-08-18 MED ORDER — BUPIVACAINE-EPINEPHRINE (PF) 0.5% -1:200000 IJ SOLN
INTRAMUSCULAR | Status: AC
  Filled 2017-08-18: qty 30

## 2017-08-18 MED ORDER — FENTANYL CITRATE (PF) 100 MCG/2ML IJ SOLN
INTRAMUSCULAR | Status: DC | PRN
  Administered 2017-08-18 (×5): 25 ug via INTRAVENOUS

## 2017-08-18 MED ORDER — ACETAMINOPHEN 10 MG/ML IV SOLN
INTRAVENOUS | Status: DC | PRN
  Administered 2017-08-18: 1000 mg via INTRAVENOUS

## 2017-08-18 MED ORDER — PROPOFOL 10 MG/ML IV BOLUS
INTRAVENOUS | Status: AC
  Filled 2017-08-18: qty 20

## 2017-08-18 MED ORDER — DEXAMETHASONE SODIUM PHOSPHATE 10 MG/ML IJ SOLN
INTRAMUSCULAR | Status: AC
  Filled 2017-08-18: qty 1

## 2017-08-18 MED ORDER — GABAPENTIN 300 MG PO CAPS
ORAL_CAPSULE | ORAL | Status: AC
  Administered 2017-08-18: 300 mg via ORAL
  Filled 2017-08-18: qty 1

## 2017-08-18 MED ORDER — MEPERIDINE HCL 50 MG/ML IJ SOLN: 6.2500 mg | INTRAMUSCULAR | Status: DC | PRN

## 2017-08-18 MED ORDER — CELECOXIB 200 MG PO CAPS
200.0000 mg | ORAL_CAPSULE | ORAL | Status: AC
  Administered 2017-08-18: 200 mg via ORAL

## 2017-08-18 MED ORDER — LIDOCAINE HCL (PF) 2 % IJ SOLN
INTRAMUSCULAR | Status: AC
  Filled 2017-08-18: qty 10

## 2017-08-18 MED ORDER — BUPIVACAINE-EPINEPHRINE 0.5% -1:200000 IJ SOLN
INTRAMUSCULAR | Status: DC | PRN
  Administered 2017-08-18: 30 mL

## 2017-08-18 MED ORDER — ACETAMINOPHEN 10 MG/ML IV SOLN
INTRAVENOUS | Status: AC
  Filled 2017-08-18: qty 100

## 2017-08-18 MED ORDER — LIDOCAINE HCL (CARDIAC) 20 MG/ML IV SOLN
INTRAVENOUS | Status: DC | PRN
  Administered 2017-08-18: 60 mg via INTRAVENOUS

## 2017-08-18 SURGICAL SUPPLY — 28 items
BLADE SURG 15 STRL SS SAFETY (BLADE) ×3 IMPLANT
BRIEF STRETCH MATERNITY 2XLG (MISCELLANEOUS) ×3 IMPLANT
CANISTER SUCT 1200ML W/VALVE (MISCELLANEOUS) ×3 IMPLANT
DRAPE LAPAROTOMY 100X77 ABD (DRAPES) ×3 IMPLANT
DRAPE LEGGINS SURG 28X43 STRL (DRAPES) ×3 IMPLANT
DRAPE UNDER BUTTOCK W/FLU (DRAPES) ×3 IMPLANT
DRSG GAUZE PETRO 6X36 STRIP ST (GAUZE/BANDAGES/DRESSINGS) ×3 IMPLANT
ELECT REM PT RETURN 9FT ADLT (ELECTROSURGICAL) ×3
ELECTRODE REM PT RTRN 9FT ADLT (ELECTROSURGICAL) ×1 IMPLANT
GLOVE BIO SURGEON STRL SZ7.5 (GLOVE) ×3 IMPLANT
GLOVE INDICATOR 8.0 STRL GRN (GLOVE) ×3 IMPLANT
GOWN STRL REUS W/ TWL LRG LVL3 (GOWN DISPOSABLE) ×2 IMPLANT
GOWN STRL REUS W/TWL LRG LVL3 (GOWN DISPOSABLE) ×4
KIT TURNOVER KIT A (KITS) ×3 IMPLANT
LABEL OR SOLS (LABEL) ×3 IMPLANT
NEEDLE HYPO 22GX1.5 SAFETY (NEEDLE) ×3 IMPLANT
NEEDLE HYPO 25X1 1.5 SAFETY (NEEDLE) ×3 IMPLANT
NS IRRIG 500ML POUR BTL (IV SOLUTION) ×3 IMPLANT
PACK BASIN MINOR ARMC (MISCELLANEOUS) ×3 IMPLANT
PAD OB MATERNITY 4.3X12.25 (PERSONAL CARE ITEMS) ×3 IMPLANT
PAD PREP 24X41 OB/GYN DISP (PERSONAL CARE ITEMS) ×3 IMPLANT
SOL PREP PVP 2OZ (MISCELLANEOUS) ×3
SOLUTION PREP PVP 2OZ (MISCELLANEOUS) ×1 IMPLANT
SURGILUBE 2OZ TUBE FLIPTOP (MISCELLANEOUS) ×3 IMPLANT
SUT SILK 0 CT 1 30 (SUTURE) ×3 IMPLANT
SUT VIC AB 3-0 SH 27 (SUTURE) ×2
SUT VIC AB 3-0 SH 27X BRD (SUTURE) ×1 IMPLANT
SYR CONTROL 10ML (SYRINGE) ×3 IMPLANT

## 2017-08-18 NOTE — Transfer of Care (Signed)
Immediate Anesthesia Transfer of Care Note  Patient: Sydnee Levanslexander L Rodriquez  Procedure(s) Performed: ANAL FISTULOTOMY (N/A )  Patient Location: PACU  Anesthesia Type:General  Level of Consciousness: drowsy  Airway & Oxygen Therapy: Patient Spontanous Breathing and Patient connected to face mask oxygen  Post-op Assessment: Report given to RN and Post -op Vital signs reviewed and stable  Post vital signs: Reviewed and stable  Last Vitals:  Vitals Value Taken Time  BP 111/59 08/18/2017 10:50 AM  Temp 36.5 C 08/18/2017 10:50 AM  Pulse 64 08/18/2017 10:51 AM  Resp 14 08/18/2017 10:51 AM  SpO2 95 % 08/18/2017 10:51 AM  Vitals shown include unvalidated device data.  Last Pain:  Vitals:   08/18/17 0911  TempSrc: Temporal  PainSc: 0-No pain         Complications: No apparent anesthesia complications

## 2017-08-18 NOTE — Progress Notes (Signed)
Ice pack applied to anal area

## 2017-08-18 NOTE — Progress Notes (Signed)
Peri pad and mesh panty dry and intact

## 2017-08-18 NOTE — H&P (Signed)
No change in clinical history or exam.   For anal fistulotomy.

## 2017-08-18 NOTE — Anesthesia Procedure Notes (Signed)
Procedure Name: LMA Insertion Date/Time: 08/18/2017 10:18 AM Performed by: Dava NajjarFrazier, Londan Coplen, CRNA Pre-anesthesia Checklist: Patient identified, Emergency Drugs available, Suction available, Patient being monitored and Timeout performed Patient Re-evaluated:Patient Re-evaluated prior to induction Oxygen Delivery Method: Circle system utilized Preoxygenation: Pre-oxygenation with 100% oxygen Induction Type: IV induction LMA: LMA inserted LMA Size: 5.0 Number of attempts: 1 Placement Confirmation: positive ETCO2 and breath sounds checked- equal and bilateral Tube secured with: Tape Dental Injury: Teeth and Oropharynx as per pre-operative assessment

## 2017-08-18 NOTE — Anesthesia Postprocedure Evaluation (Signed)
Anesthesia Post Note  Patient: Cody Dudley  Procedure(s) Performed: ANAL FISTULOTOMY (N/A )  Patient location during evaluation: PACU Anesthesia Type: General Level of consciousness: awake and alert Pain management: pain level controlled Vital Signs Assessment: post-procedure vital signs reviewed and stable Respiratory status: spontaneous breathing, nonlabored ventilation, respiratory function stable and patient connected to nasal cannula oxygen Cardiovascular status: blood pressure returned to baseline and stable Postop Assessment: no apparent nausea or vomiting Anesthetic complications: no     Last Vitals:  Vitals:   08/18/17 1149 08/18/17 1224  BP: 119/74 127/80  Pulse: 74 77  Resp: 16 18  Temp: (!) 36.3 C   SpO2: 97% 97%    Last Pain:  Vitals:   08/18/17 1224  TempSrc:   PainSc: 2                  Lenard SimmerAndrew Gerlad Pelzel

## 2017-08-18 NOTE — Anesthesia Post-op Follow-up Note (Signed)
Anesthesia QCDR form completed.        

## 2017-08-18 NOTE — Op Note (Signed)
Preoperative diagnosis: Anal fistula.  Postoperative diagnosis: Same.  Operative procedure: Exam under anesthesia, anal fistulotomy.  Operating Surgeon: Donnalee CurryJeffrey Ankita Newcomer, MD.  Anesthesia: General by LMA, Marcaine 0.5% with 1-200,000 units of epinephrine, 30 cc.  Estimated blood loss: Less than 5 cc.  Clinical note: This is a 27 year old male has had persistent drainage from a small sinus to the right of the anus.  Clinical exam was consistent with a anal fistula.  He was brought to the operating for incision and drainage.  Operative note: With the patient under adequate general anesthesia and having received cefotetan the perineum was cleansed with Betadine solution and draped.  The fistula site was cannulated with a lacrimal duct probe and the pathway easily lead to the dentate line anteriorly.  The skin was incised with a knife and the remaining dissection completed with electrocautery.  The superficial sphincter and internal sphincter was divided.  The deep anal sphincter was intact.  There was a 1 cm pocket lateral to the deep sphincter.  This appeared to be a blind pouch.  The fistula tract and the pouch was debrided with a curette.  All of the chronic inflammatory tissue was removed.  An anoscope was placed and examination showed the rectal mucosa to be unremarkable and no evidence of a proximal fistula site.  Hemostasis was with electrocautery.  A dry peri-pad was applied and the patient taken to recovery room in stable condition.

## 2017-08-18 NOTE — Anesthesia Preprocedure Evaluation (Signed)
Anesthesia Evaluation  Patient identified by MRN, date of birth, ID band Patient awake    Reviewed: Allergy & Precautions, H&P , NPO status , Patient's Chart, lab work & pertinent test results, reviewed documented beta blocker date and time   History of Anesthesia Complications Negative for: history of anesthetic complications  Airway Mallampati: I  TM Distance: >3 FB Neck ROM: full    Dental  (+) Dental Advidsory Given, Teeth Intact   Pulmonary neg pulmonary ROS,           Cardiovascular Exercise Tolerance: Good negative cardio ROS       Neuro/Psych negative neurological ROS  negative psych ROS   GI/Hepatic negative GI ROS, Neg liver ROS,   Endo/Other  negative endocrine ROS  Renal/GU negative Renal ROS  negative genitourinary   Musculoskeletal   Abdominal   Peds  Hematology negative hematology ROS (+)   Anesthesia Other Findings Past Medical History: No date: Complication of anesthesia     Comment:  "COULD NOT STOP SHAKING AFTER SHOULDER SURGERY"   Reproductive/Obstetrics negative OB ROS                             Anesthesia Physical Anesthesia Plan  ASA: I  Anesthesia Plan: General   Post-op Pain Management:    Induction: Intravenous  PONV Risk Score and Plan: 2 and Ondansetron and Dexamethasone  Airway Management Planned: LMA  Additional Equipment:   Intra-op Plan:   Post-operative Plan: Extubation in OR  Informed Consent: I have reviewed the patients History and Physical, chart, labs and discussed the procedure including the risks, benefits and alternatives for the proposed anesthesia with the patient or authorized representative who has indicated his/her understanding and acceptance.   Dental Advisory Given  Plan Discussed with: Anesthesiologist, CRNA and Surgeon  Anesthesia Plan Comments:         Anesthesia Quick Evaluation

## 2017-08-23 ENCOUNTER — Ambulatory Visit: Payer: Managed Care, Other (non HMO) | Admitting: General Surgery

## 2017-08-24 ENCOUNTER — Encounter: Payer: Self-pay | Admitting: General Surgery

## 2017-08-24 ENCOUNTER — Ambulatory Visit (INDEPENDENT_AMBULATORY_CARE_PROVIDER_SITE_OTHER): Payer: Managed Care, Other (non HMO) | Admitting: General Surgery

## 2017-08-24 VITALS — BP 124/82 | HR 96 | Resp 16 | Ht 68.0 in | Wt 212.0 lb

## 2017-08-24 DIAGNOSIS — K603 Anal fistula: Secondary | ICD-10-CM

## 2017-08-24 NOTE — Progress Notes (Signed)
Patient ID: Cody Dudley, male   DOB: 12/26/1990, 27 y.o.   MRN: [id number]  Chief Complaint  Patient presents with  . Routine Post Op    HPI Cody Dudley is a 27 y.o. male.  Here for postoperative visit, anal fistulotomy on 08-18-17. He states some pain and some drainage. Bowels move daily.  Patient reports Some pain with defecation, but no control issues.  Doing well post anal fistulotomy.  HPI  Past Medical History:  Diagnosis Date  . Complication of anesthesia    "COULD NOT STOP SHAKING AFTER SHOULDER SURGERY"    Past Surgical History:  Procedure Laterality Date  . ANAL FISTULOTOMY N/A 08/18/2017   Procedure: ANAL FISTULOTOMY;  Surgeon: Earline MayotteByrnett, Sumaiya Arruda W, MD;  Location: [facility] ORS;  Service: General;  Laterality: N/A;  . SHOULDER SURGERY Left    rotator cuff    Family History  Problem Relation Age of Onset  . Kidney failure Mother     Social History Social History   Tobacco Use  . Smoking status: Never Smoker  . Smokeless tobacco: Never Used  Substance Use Topics  . Alcohol use: Yes    Comment: OCC  . Drug use: No    No Known Allergies  Current Outpatient Medications  Medication Sig Dispense Refill  . ibuprofen (ADVIL,MOTRIN) 200 MG tablet Take 800 mg by mouth every 6 (six) hours as needed for headache or moderate pain.     No current facility-administered medications for this visit.     Review of Systems Review of Systems  Constitutional: Negative.   Respiratory: Negative.   Cardiovascular: Negative.     Blood pressure 124/82, pulse 96, resp. rate 16, height 5\' 8"  (1.727 m), weight 212 lb (96.2 kg).  Physical Exam Physical Exam  Constitutional: He is oriented to person, place, and time. He appears well-developed and well-nourished.  Genitourinary:     Genitourinary Comments: Area healing well  Neurological: He is alert and oriented to person, place, and time.  Skin: Skin is warm and dry.  Psychiatric: His behavior is normal.       Assessment    Doing well post anal fistulotomy.    Plan    Follow up in 2 weeks     HPI, Physical Exam, Assessment and Plan have been scribed under the direction and in the presence of Earline MayotteJeffrey W. Juliza Machnik, MD. Dorathy DaftMarsha Hatch, RN  I have completed the exam and reviewed the above documentation for accuracy and completeness.  I agree with the above.  [employer] has been used and any errors in dictation or transcription are unintentional.  Donnalee CurryJeffrey Zeno Hickel, M.D., F.A.C.S.  Merrily PewJeffrey W Eyonna Sandstrom 08/24/2017, 9:01 PM

## 2017-08-24 NOTE — Patient Instructions (Signed)
The patient is aware to call back for any questions or concerns.  
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
## 2017-09-07 ENCOUNTER — Encounter: Payer: Self-pay | Admitting: General Surgery

## 2017-09-07 ENCOUNTER — Ambulatory Visit (INDEPENDENT_AMBULATORY_CARE_PROVIDER_SITE_OTHER): Payer: Managed Care, Other (non HMO) | Admitting: General Surgery

## 2017-09-07 VITALS — BP 130/78 | HR 72 | Resp 14 | Ht 68.0 in | Wt 212.0 lb

## 2017-09-07 DIAGNOSIS — K603 Anal fistula: Secondary | ICD-10-CM

## 2017-09-07 NOTE — Patient Instructions (Signed)
Return in two weeks. The patient is aware to call back for any questions or concerns.  

## 2017-09-07 NOTE — Progress Notes (Signed)
Patient ID: Cody Dudley, male   DOB: 11/01/1990, 27 y.o.   MRN: [id number]  Chief Complaint  Patient presents with  . Follow-up    HPI Cody Dudley is a 27 y.o. male Here for postoperative visit, anal fistulotomy on 08-18-17. He states some pain and some drainage. Bowels move daily  HPI  Past Medical History:  Diagnosis Date  . Complication of anesthesia    "COULD NOT STOP SHAKING AFTER SHOULDER SURGERY"    Past Surgical History:  Procedure Laterality Date  . ANAL FISTULOTOMY N/A 08/18/2017   Procedure: ANAL FISTULOTOMY;  Surgeon: Earline MayotteByrnett, Meckenzie Balsley W, MD;  Location: [facility] ORS;  Service: General;  Laterality: N/A;  . SHOULDER SURGERY Left    rotator cuff    Family History  Problem Relation Age of Onset  . Kidney failure Mother     Social History Social History   Tobacco Use  . Smoking status: Never Smoker  . Smokeless tobacco: Never Used  Substance Use Topics  . Alcohol use: Yes    Comment: OCC  . Drug use: No    No Known Allergies  Current Outpatient Medications  Medication Sig Dispense Refill  . ibuprofen (ADVIL,MOTRIN) 200 MG tablet Take 800 mg by mouth every 6 (six) hours as needed for headache or moderate pain.     No current facility-administered medications for this visit.     Review of Systems Review of Systems  Constitutional: Negative.   Respiratory: Negative.   Cardiovascular: Negative.     Blood pressure 130/78, pulse 72, resp. rate 14, height 5\' 8"  (1.727 m), weight 212 lb (96.2 kg).  Physical Exam Physical Exam  Genitourinary:       Data Reviewed Area treated with silver nitrate.  Assessment    Steady progress post fistulotomy.    Plan  Return in two weeks.  The patient is aware to call back for any questions or concerns.  HPI, Physical Exam, Assessment and Plan have been scribed under the direction and in the presence of Donnalee CurryJeffrey Yaritsa Savarino, MD.  Ples SpecterJessica Qualls, CMA  I have completed the exam and reviewed the above  documentation for accuracy and completeness.  I agree with the above.  [employer]gallery [employer] has been used and any errors in dictation or transcription are unintentional.  Donnalee CurryJeffrey Jb Dulworth, M.D., F.A.C.S.  Merrily PewJeffrey W Cassidi Modesitt 09/07/2017, 2:33 PM

## 2017-09-19 ENCOUNTER — Encounter: Payer: Self-pay | Admitting: General Surgery

## 2017-09-19 ENCOUNTER — Ambulatory Visit (INDEPENDENT_AMBULATORY_CARE_PROVIDER_SITE_OTHER): Payer: Managed Care, Other (non HMO) | Admitting: General Surgery

## 2017-09-19 VITALS — BP 120/80 | HR 74 | Resp 14 | Ht 66.0 in | Wt 212.0 lb

## 2017-09-19 DIAGNOSIS — K603 Anal fistula: Secondary | ICD-10-CM

## 2017-09-19 NOTE — Patient Instructions (Addendum)
Patient to return as needed. Call and report in 2 weeks.

## 2017-09-19 NOTE — Progress Notes (Signed)
Patient ID: Cody Dudley, male   DOB: 1990/12/10, 27 y.o.   MRN: [id number]  Chief Complaint  Patient presents with  . Follow-up    HPI Cody Dudley is a 27 y.o. male here today for fistulotomy done on 08/18/2017. He states the area is still draining. HPI  Past Medical History:  Diagnosis Date  . Complication of anesthesia    "COULD NOT STOP SHAKING AFTER SHOULDER SURGERY"    Past Surgical History:  Procedure Laterality Date  . ANAL FISTULOTOMY N/A 08/18/2017   Procedure: ANAL FISTULOTOMY;  Surgeon: Earline Mayotte, MD;  Location: [facility] ORS;  Service: General;  Laterality: N/A;  . SHOULDER SURGERY Left    rotator cuff    Family History  Problem Relation Age of Onset  . Kidney failure Mother     Social History Social History   Tobacco Use  . Smoking status: Never Smoker  . Smokeless tobacco: Never Used  Substance Use Topics  . Alcohol use: Yes    Comment: OCC  . Drug use: No    No Known Allergies  Current Outpatient Medications  Medication Sig Dispense Refill  . ibuprofen (ADVIL,MOTRIN) 200 MG tablet Take 800 mg by mouth every 6 (six) hours as needed for headache or moderate pain.     No current facility-administered medications for this visit.     Review of Systems Review of Systems  Constitutional: Negative.   Respiratory: Negative.   Cardiovascular: Negative.     Blood pressure 120/80, pulse 74, resp. rate 14, height  (1.676 m), weight 212 lb (96.2 kg).  Physical Exam Physical Exam  Constitutional: He is oriented to person, place, and time. He appears well-developed and well-nourished.  Genitourinary:     Neurological: He is alert and oriented to person, place, and time.  Skin: Skin is warm and dry.       Assessment    Fistulotomy site appears to be healing well.    Plan    Drainage seems to be out of proportion to visualized residual granulation tissue.  Hopefully this last application of silver nitrate will resolve this  issue.  The patient's been asked to give a call in 2 weeks, and if he has any residual drainage she will be asked to return for reassessment.    HPI, Physical Exam, Assessment and Plan have been scribed under the direction and in the presence of Donnalee Curry, MD.  Ples Specter, CMA  I have completed the exam and reviewed the above documentation for accuracy and completeness.  I agree with the above.  Museum/gallery conservator has been used and any errors in dictation or transcription are unintentional.  Donnalee Curry, M.D., F.A.C.S.  Merrily Pew Saxton Chain 09/20/2017, 6:00 PM

## 2017-11-08 ENCOUNTER — Ambulatory Visit: Payer: Self-pay | Admitting: Family Medicine

## 2017-11-08 VITALS — BP 133/77 | HR 68 | Resp 16 | Ht 68.0 in | Wt 208.0 lb

## 2017-11-08 DIAGNOSIS — Z008 Encounter for other general examination: Secondary | ICD-10-CM

## 2017-11-08 DIAGNOSIS — Z0189 Encounter for other specified special examinations: Principal | ICD-10-CM

## 2017-11-08 NOTE — Progress Notes (Signed)
Subjective: Annual biometrics screening  Patient presents for his annual biometric screening. Patient reports that he does not eat a healthy, well-rounded diet but he does get regular physical activity.  Patient does not regularly see a primary care provider. PCP: None currently. 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 Patient denies any other issues or concerns.   Review of Systems Unremarkable  Objective  Physical Exam General: Awake, alert and oriented. No acute distress. Well developed, hydrated and nourished. Appears stated age.  HEENT: Supple neck without adenopathy. Sclera is non-icteric. The ear canal is clear without discharge. The tympanic membrane is normal in appearance with normal landmarks and cone of light. Nasal mucosa is pink and moist. Oral mucosa is pink and moist. The pharynx is normal in appearance without tonsillar swelling or exudates.  Skin: Skin in warm, dry and intact without rashes or lesions. Appropriate color for ethnicity. Cardiac: Heart rate and rhythm are normal. No murmurs, gallops, or rubs are auscultated.  Respiratory: The chest wall is symmetric and without deformity. No signs of respiratory distress. Lung sounds are clear in all lobes bilaterally without rales, ronchi, or wheezes.  Neurological: The patient is awake, alert and oriented to person, place, and time with normal speech.  Memory is normal and thought processes intact. No gait abnormalities are appreciated.  Psychiatric: Appropriate mood and affect.   Assessment Annual biometrics screening  Plan  Lipid panel and fasting blood sugar pending. Encouraged routine visits with primary care provider.  Provided patient with a list of local resources and encouraged him to establish care with a primary care provider. Encouraged patient to get regular exercise and eat a healthy, well-rounded diet.

## 2017-11-09 LAB — LIPID PANEL
CHOLESTEROL TOTAL: 163 mg/dL (ref 100–199)
Chol/HDL Ratio: 3.5 ratio (ref 0.0–5.0)
HDL: 46 mg/dL (ref >39)
LDL Calculated: 105 mg/dL — ABNORMAL HIGH (ref 0–99)
Triglycerides: 62 mg/dL (ref 0–149)
VLDL Cholesterol Cal: 12 mg/dL (ref 5–40)

## 2017-11-09 LAB — GLUCOSE, RANDOM: Glucose: 94 mg/dL (ref 65–99)

## 2017-11-09 NOTE — Progress Notes (Signed)
Carollee HerterShannon, Will you call the patient and inform them that their lipid panel and fasting blood sugar came back?  Everything is normal, with the exception of his LDL cholesterol. The LDL cholesterol ("bad cholesterol") is elevated at 105, normal values are below 99. Please advise the patient to follow-up with their primary care provider regarding these results.

## 2017-12-11 ENCOUNTER — Other Ambulatory Visit
Admission: AD | Admit: 2017-12-11 | Payer: Managed Care, Other (non HMO) | Source: Home / Self Care | Admitting: Family Medicine
